# Patient Record
Sex: Male | Born: 1970 | Race: Black or African American | Hispanic: No | Marital: Single | Smoking: Current every day smoker
Health system: Southern US, Community
[De-identification: ages and names within clinical notes are randomized; demographics above are authoritative.]

---

## 2008-07-20 ENCOUNTER — Emergency Department (HOSPITAL_COMMUNITY): Admission: EM | Admit: 2008-07-20 | Discharge: 2008-07-20 | Payer: Self-pay | Admitting: Emergency Medicine

## 2010-06-04 IMAGING — CR DG CHEST 2V
2 series · 2 of 2 positions shown · non-contrast
Comparison: None

CLINICAL DATA: Fever, cough, smoking history

CHEST - 2 VIEW

[w chest pa]
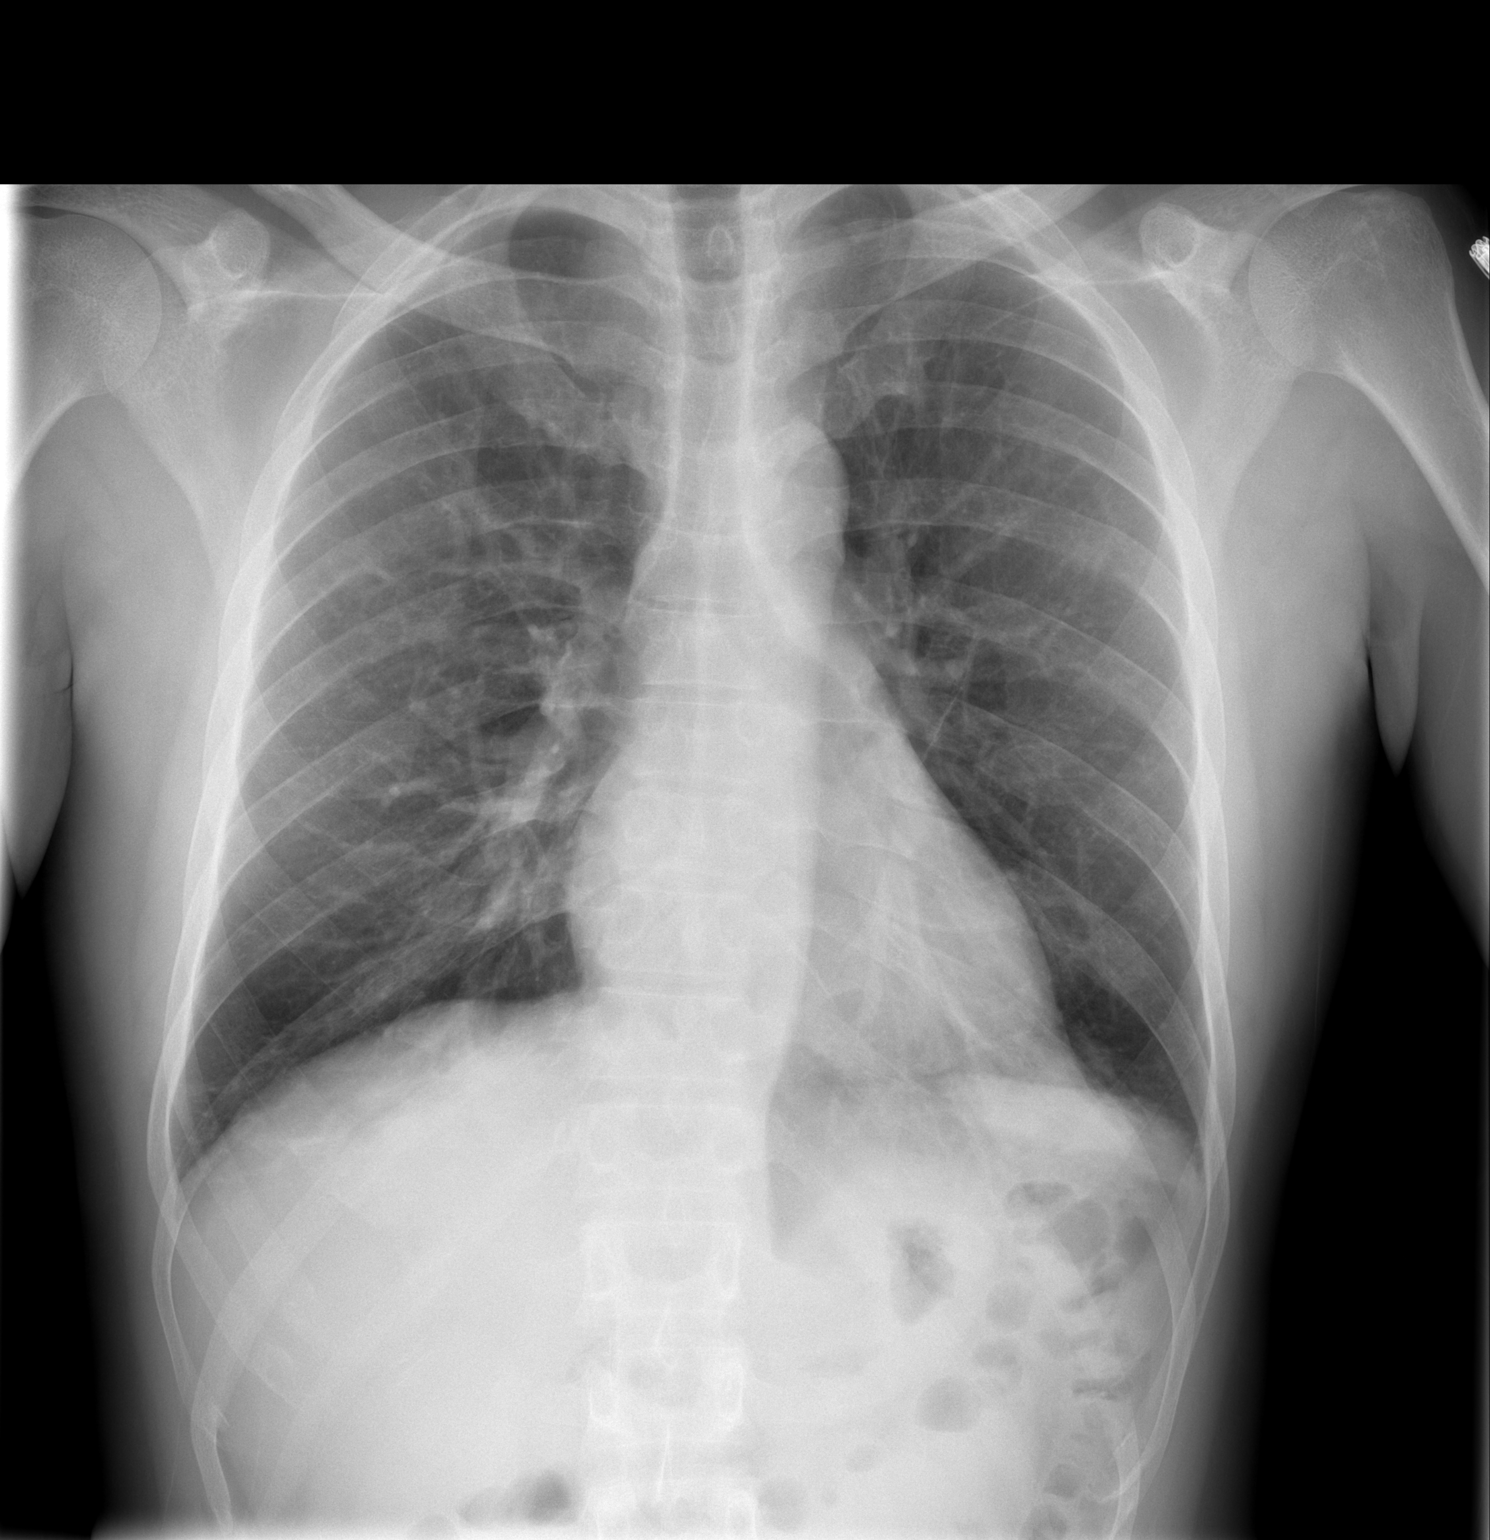

[w chest lat]
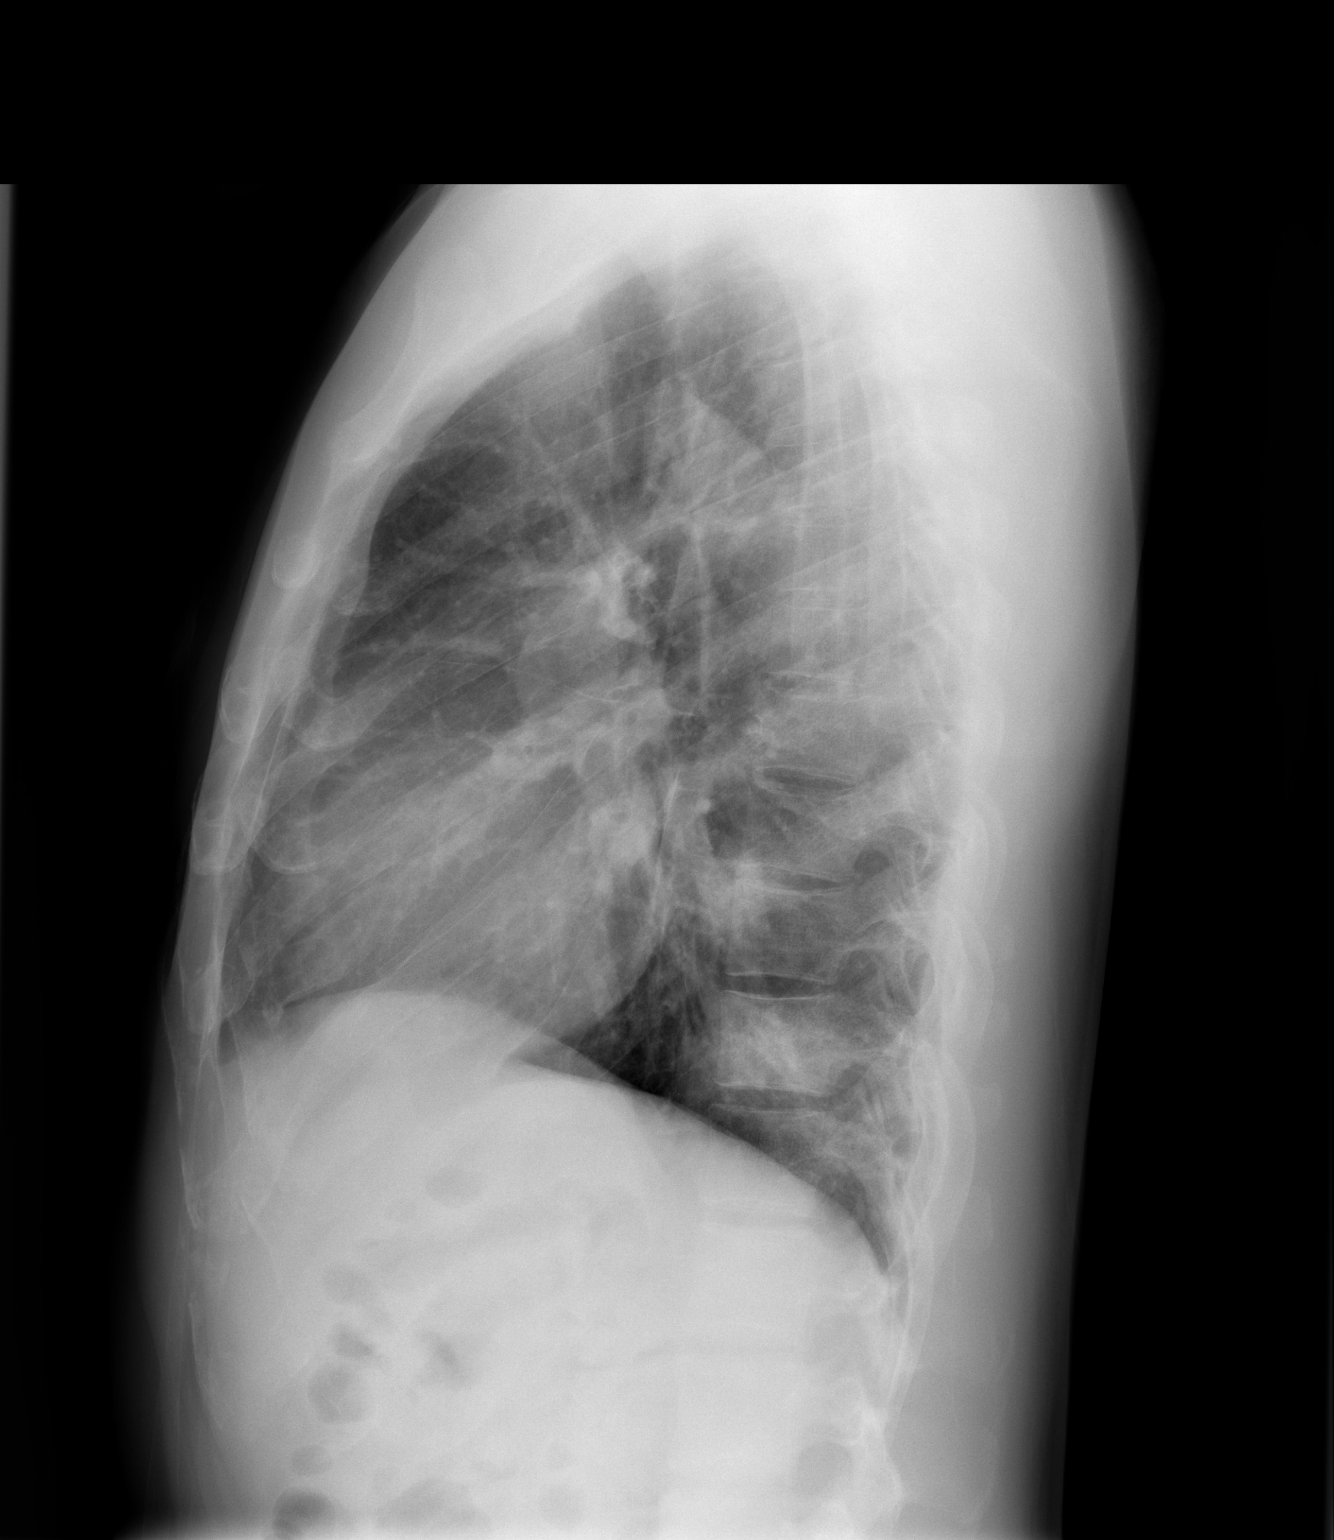

[2 of 2 positions shown; findings below may reference images not displayed]

FINDINGS: There is opacity in both lower lobes left slightly
greater than right most consistent with bilateral lower lobe
pneumonia.  No effusion is seen.  The heart is within normal limits
in size.  No acute bony abnormality is noted.
IMPRESSION: Lower lobe opacities left greater than right most consistent with
pneumonia.

## 2010-08-23 LAB — BASIC METABOLIC PANEL
BUN: 9 mg/dL (ref 6–23)
CO2: 25 mEq/L (ref 19–32)
Chloride: 103 mEq/L (ref 96–112)
GFR calc non Af Amer: >60 mL/min (ref >60)
Glucose, Bld: 145 mg/dL — ABNORMAL HIGH (ref 70–99)
Potassium: 3.6 mEq/L (ref 3.5–5.1)
Sodium: 136 mEq/L (ref 135–145)

## 2010-08-23 LAB — DIFFERENTIAL
Basophils Absolute: 0 10*3/uL (ref 0.0–0.1)
Eosinophils Absolute: 0 10*3/uL (ref 0.0–0.7)
Eosinophils Relative: 0 % (ref 0–5)
Lymphocytes Relative: 8 % — ABNORMAL LOW (ref 12–46)
Lymphs Abs: 1 10*3/uL (ref 0.7–4.0)
Monocytes Absolute: 1.1 10*3/uL — ABNORMAL HIGH (ref 0.1–1.0)

## 2010-08-23 LAB — CBC
HCT: 40.6 % (ref 39.0–52.0)
Hemoglobin: 13.9 g/dL (ref 13.0–17.0)
MCV: 94.6 fL (ref 78.0–100.0)
RDW: 14.4 % (ref 11.5–15.5)

## 2013-12-01 DIAGNOSIS — H401113 Primary open-angle glaucoma, right eye, severe stage: Secondary | ICD-10-CM | POA: Insufficient documentation

## 2014-10-07 DIAGNOSIS — H401122 Primary open-angle glaucoma, left eye, moderate stage: Secondary | ICD-10-CM | POA: Insufficient documentation

## 2016-12-12 ENCOUNTER — Encounter: Payer: Self-pay | Admitting: Podiatry

## 2016-12-12 ENCOUNTER — Ambulatory Visit (INDEPENDENT_AMBULATORY_CARE_PROVIDER_SITE_OTHER): Payer: 59

## 2016-12-12 ENCOUNTER — Ambulatory Visit (INDEPENDENT_AMBULATORY_CARE_PROVIDER_SITE_OTHER): Payer: 59 | Admitting: Podiatry

## 2016-12-12 VITALS — BP 101/69 | HR 72 | Resp 16

## 2016-12-12 DIAGNOSIS — Q828 Other specified congenital malformations of skin: Secondary | ICD-10-CM | POA: Diagnosis not present

## 2016-12-12 DIAGNOSIS — M2012 Hallux valgus (acquired), left foot: Secondary | ICD-10-CM

## 2016-12-12 DIAGNOSIS — M069 Rheumatoid arthritis, unspecified: Secondary | ICD-10-CM | POA: Diagnosis not present

## 2016-12-12 LAB — CBC WITH DIFFERENTIAL/PLATELET
Basophils Absolute: 0 cells/uL (ref 0–200)
Basophils Relative: 0 %
EOS PCT: 1 %
Eosinophils Absolute: 31 cells/uL (ref 15–500)
HCT: 45.1 % (ref 38.5–50.0)
HEMOGLOBIN: 14.6 g/dL (ref 13.2–17.1)
LYMPHS ABS: 1612 {cells}/uL (ref 850–3900)
Lymphocytes Relative: 52 %
MCH: 31.2 pg (ref 27.0–33.0)
MCHC: 32.4 g/dL (ref 32.0–36.0)
MCV: 96.4 fL (ref 80.0–100.0)
MPV: 8.6 fL (ref 7.5–12.5)
Monocytes Absolute: 403 cells/uL (ref 200–950)
Monocytes Relative: 13 %
NEUTROS ABS: 1054 {cells}/uL — AB (ref 1500–7800)
Neutrophils Relative %: 34 %
PLATELETS: 279 10*3/uL (ref 140–400)
RBC: 4.68 MIL/uL (ref 4.20–5.80)
RDW: 14.3 % (ref 11.0–15.0)
WBC: 3.1 10*3/uL — AB (ref 3.8–10.8)

## 2016-12-12 NOTE — Progress Notes (Signed)
He presents today concerned about his great toe left. He states that he has an interdigital painful lesion for the past 2 weeks. He states that it was open but is now closed his been cleaning with peroxide. He states it is very tender. He states that things because about this large bunion and I have an orthopedic doctor taking care of the bunion thinking that he may need surgery. He tried an injection and orthotics.  Objective: Vital signs stable alert and oriented 3 pulses are strongly palpable. Neurologic sensorium is intact. Deep tendon reflexes are intact. Muscle strength +5 over 5 dorsiflexion plantar flexors and inverters everters onto the musculature is intact. Orthopedic evaluation demonstrates hallux valgus deformity with what appears to be rheumatoid lateral deviation of the lesser metatarsals as well. Radiographs taken today do not specifically display rheumatoid disease however lateral deviation of the hallux and near subluxation of the first metatarsophalangeal joint would be indicative of that. He does relate swelling prior to this deviation occurring. Reactive hyperkeratotic lesion lateral aspect of the left hallux due to the juxtaposition of the hallux and second toe.  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 Cannot really rule out rheumatoid arthritis  Plan: Request blood work for rheumatoid arthritis. Reactive hyperkeratotic lesion discussed surgical intervention. Dispensed interdigital pads. Follow-up with him in 2-3 weeks

## 2016-12-13 LAB — ANA, IFA COMPREHENSIVE PANEL
Anti Nuclear Antibody(ANA): NEGATIVE
ENA SM AB SER-ACNC: NEGATIVE
SCLERODERMA (SCL-70) (ENA) ANTIBODY, IGG: NEGATIVE
SM/RNP: NEGATIVE
SSA (RO) (ENA) ANTIBODY, IGG: NEGATIVE
SSB (La) (ENA) Antibody, IgG: <1
ds DNA Ab: 13 IU/mL — ABNORMAL HIGH

## 2016-12-13 LAB — SEDIMENTATION RATE: SED RATE: 1 mm/h (ref 0–15)

## 2016-12-13 LAB — URIC ACID: URIC ACID, SERUM: 4.5 mg/dL (ref 4.0–8.0)

## 2016-12-13 LAB — C-REACTIVE PROTEIN: CRP: 0.5 mg/L (ref <8.0)

## 2016-12-13 LAB — RHEUMATOID FACTOR: Rhuematoid fact SerPl-aCnc: <14 IU/mL (ref <14)

## 2016-12-17 ENCOUNTER — Telehealth: Payer: Self-pay | Admitting: *Deleted

## 2016-12-17 NOTE — Telephone Encounter (Addendum)
-----   Message from Elinor Parkinson, [place] sent at 12/16/2016  7:40 AM EDT ----- Refer to rheumatology and send labs.12/17/2016-I informed of Dr. Geryl Rankins review of labs and referral to Rheumatology. Pt asked if the Rheumatologist accepted Workers Compensation, I told him I could speak with his workers Hydrologist. Pt states he will bring that information to the office tomorrow.12/19/2016-Called Office of [employer] states waiting behind 12 callers. 12/20/2016-Called Office of [employer] states waiting behind 17 callers, 12 minute wait minimum. 01/02/2017-Referral required form, clinicals and demographics including Workers' Comp paperwork faxed to [facility].

## 2017-02-11 ENCOUNTER — Ambulatory Visit: Payer: 59 | Admitting: Podiatry

## 2017-07-01 ENCOUNTER — Ambulatory Visit: Payer: 59 | Admitting: Podiatry

## 2017-07-17 ENCOUNTER — Telehealth: Payer: Self-pay | Admitting: Podiatry

## 2017-07-17 NOTE — Telephone Encounter (Signed)
Called pt and let him know that his medical records were ready to be picked up at his convenience.

## 2018-04-21 ENCOUNTER — Ambulatory Visit: Payer: Worker's Compensation | Admitting: Podiatry

## 2018-04-27 ENCOUNTER — Ambulatory Visit (INDEPENDENT_AMBULATORY_CARE_PROVIDER_SITE_OTHER): Payer: Worker's Compensation

## 2018-04-27 ENCOUNTER — Ambulatory Visit (INDEPENDENT_AMBULATORY_CARE_PROVIDER_SITE_OTHER): Payer: Worker's Compensation | Admitting: Podiatry

## 2018-04-27 ENCOUNTER — Other Ambulatory Visit: Payer: Self-pay | Admitting: Podiatry

## 2018-04-27 DIAGNOSIS — M19072 Primary osteoarthritis, left ankle and foot: Secondary | ICD-10-CM

## 2018-04-27 DIAGNOSIS — Z9889 Other specified postprocedural states: Secondary | ICD-10-CM

## 2018-04-27 DIAGNOSIS — G5792 Unspecified mononeuropathy of left lower limb: Secondary | ICD-10-CM

## 2018-04-27 DIAGNOSIS — S99922A Unspecified injury of left foot, initial encounter: Secondary | ICD-10-CM

## 2018-04-27 MED ORDER — DICLOFENAC SODIUM 75 MG PO TBEC: 75.0000 mg | DELAYED_RELEASE_TABLET | Freq: Two times a day (BID) | ORAL | 0 refills | Status: DC

## 2018-04-27 NOTE — Progress Notes (Signed)
   HPI: 47 year old otherwise healthy male presents the office today for a second opinion regarding his surgery that he had in the past.  In November 2018 the patient underwent bunion repair by Dr. Delrae Sawyers, [facility], in [place], [place].  Patient has had residual pain and tenderness ever since the surgery.  He is also lost the ability to move his toes despite no surgery other than the bunion repair.  He continues to have daily pain.  He has been doing physical therapy for the past year without any improvement of symptoms. Patient was last seen here in the office on 12/12/2016 by Dr. Al Corpus.  At that time arthritic panel was ordered, and patient never proceeded with surgery at that time.  No past medical history on file.   Physical Exam: General: The patient is alert and oriented x3 in no acute distress.  Dermatology: Skin is warm, dry and supple bilateral lower extremities. Negative for open lesions or macerations.  Vascular: Palpable pedal pulses bilaterally. No edema or erythema noted. Capillary refill within normal limits.  Neurological: Epicritic and protective threshold grossly intact bilaterally.   Musculoskeletal Exam: Range of motion within normal limits to all pedal and ankle joints bilateral. Muscle strength absent with flexion and extension of the digits 1-5 left foot.  There is limited range of motion to the first MTPJ with pain on palpation range of motion noted as well.  Radiographic Exam:  Normal osseous mineralization.  Joint space narrowing with degenerative changes noted the first MTPJ left foot.  Orthopedic plate and screws appear to be intact along the first metatarsal.  Arthrex tight rope intact across the first interspace left foot.  Hardware otherwise intact with the first Delaney in good alignment.  No fracture/dislocation/boney destruction.    Assessment: 1.  History of bunionectomy left foot. DOS: 03/2017 2.  DJD first MTPJ left 3.  Loss of motion digits 1-5  left forefoot secondary to likely nerve damage  Plan of Care:  1. Patient evaluated. X-Rays reviewed.  2.  Today we are going to order nerve conduction study to determine the level of neuropathic changes.  If there is anything significant we will likely order neurology consult 3.  I explained to the patient that surgically the only thing that could be performed is removal of hardware which is palpable on clinical exam and sensitive.  Also offer a joint arthroplasty with implant to the first MTPJ to increase the range of motion.  I explained to him that surgically there is nothing that can be provided to increase the patient's flexion and extension of the digits.  This is strictly a neuropathic etiology. 4.  Continue wearing custom molded orthotics 5.  Return to clinic in 4 weeks to review nerve conduction velocity exams      Felecia Shelling, DPM [facility]  Dr. Felecia Shelling, DPM    [address] [place], [place] [address]                Office [phone]  Fax [phone]

## 2018-04-28 ENCOUNTER — Telehealth: Payer: Self-pay | Admitting: *Deleted

## 2018-04-28 ENCOUNTER — Other Ambulatory Visit: Payer: Self-pay | Admitting: *Deleted

## 2018-04-28 ENCOUNTER — Encounter: Payer: Self-pay | Admitting: Neurology

## 2018-04-28 DIAGNOSIS — G629 Polyneuropathy, unspecified: Secondary | ICD-10-CM

## 2018-04-28 DIAGNOSIS — G5792 Unspecified mononeuropathy of left lower limb: Secondary | ICD-10-CM

## 2018-04-28 DIAGNOSIS — M2012 Hallux valgus (acquired), left foot: Secondary | ICD-10-CM

## 2018-04-28 DIAGNOSIS — M19072 Primary osteoarthritis, left ankle and foot: Secondary | ICD-10-CM

## 2018-04-28 NOTE — Telephone Encounter (Signed)
-----   Message from Felecia Shelling, DPM sent at 04/27/2018  6:23 PM EST ----- Regarding: Nerve conduction study Please order nerve conduction study left lower extremity.   Dx: Iatrogenic neuropathy left lower extremity.  Loss of strength and motion of the digits postsurgically.  Patient had bunion surgery on 03/2017 in [place].   Thanks, Dr. Logan Bores

## 2018-04-28 NOTE — Telephone Encounter (Signed)
Faxed orders to [facility]. 

## 2018-04-28 NOTE — Telephone Encounter (Signed)
Pt states he would be okay with testing in [place], I informed pt of [facility] [phone].

## 2018-05-07 ENCOUNTER — Ambulatory Visit: Payer: Self-pay | Admitting: Neurology

## 2018-05-07 ENCOUNTER — Encounter

## 2018-05-25 ENCOUNTER — Ambulatory Visit: Payer: Worker's Compensation | Admitting: Podiatry

## 2018-05-26 ENCOUNTER — Encounter: Payer: Self-pay | Admitting: Neurology

## 2018-11-24 DIAGNOSIS — T8484XA Pain due to internal orthopedic prosthetic devices, implants and grafts, initial encounter: Secondary | ICD-10-CM | POA: Insufficient documentation

## 2020-02-24 ENCOUNTER — Other Ambulatory Visit (HOSPITAL_COMMUNITY)
Admission: RE | Admit: 2020-02-24 | Discharge: 2020-02-24 | Disposition: A | Discharge status: Home / Self Care | Payer: 59 | Source: Ambulatory Visit | Attending: Critical Care Medicine | Admitting: Critical Care Medicine

## 2020-02-24 ENCOUNTER — Ambulatory Visit: Payer: 59 | Admitting: Physician Assistant

## 2020-02-24 VITALS — BP 127/82 | HR 79 | Temp 96.6°F | Resp 18 | Ht 73.0 in | Wt 150.0 lb

## 2020-02-24 DIAGNOSIS — Z202 Contact with and (suspected) exposure to infections with a predominantly sexual mode of transmission: Secondary | ICD-10-CM

## 2020-02-24 MED ORDER — METRONIDAZOLE 500 MG PO TABS
2000.0000 mg | ORAL_TABLET | Freq: Once | ORAL | Status: AC
Start: 2020-02-24 — End: 2020-02-24
  Administered 2020-02-24: 2000 mg via ORAL

## 2020-02-24 MED ORDER — METRONIDAZOLE 500 MG PO TABS: 500.0000 mg | ORAL_TABLET | Freq: Once | ORAL | Status: DC

## 2020-02-24 NOTE — Progress Notes (Signed)
Patients partner shared he may have an exposure to trichomonas due to their positive result. Patient complains of itching and denies burning, discharge and blood.

## 2020-02-24 NOTE — Patient Instructions (Signed)
I encourage you to locate and make an appointment with a primary care provider in your network.  Please feel free to return to the mobile unit next month for fasting labs  We will call you with your lab results  Please let us know if there is anything else we can do for you  Roney Jaffe, PA-C Physician Assistant [facility] Medicine [email or web address]   Trichomoniasis Trichomoniasis is an STI (sexually transmitted infection) that can affect both women and men. In women, the outer area of the male genitalia (vulva) and the vagina are affected. In men, mainly the penis is affected, but the prostate and other reproductive organs can also be involved.  This condition can be treated with medicine. It often has no symptoms (is asymptomatic), especially in men. If not treated, trichomoniasis can last for months or years. What are the causes? This condition is caused by a parasite called Trichomonas vaginalis. Trichomoniasis most often spreads from person to person (is contagious) through sexual contact. What increases the risk? The following factors may make you more likely to develop this condition:  Having unprotected sex.  Having sex with a partner who has trichomoniasis.  Having multiple sexual partners.  Having had previous trichomoniasis infections or other STIs. What are the signs or symptoms? In women, symptoms of trichomoniasis include:  Abnormal vaginal discharge that is clear, white, gray, or yellow-green and foamy and has an unusual "fishy" odor.  Itching and irritation of the vagina and vulva.  Burning or pain during urination or sex.  Redness and swelling of the genitals. In men, symptoms of trichomoniasis include:  Penile discharge that may be foamy or contain pus.  Pain in the penis. This may happen only when urinating.  Itching or irritation inside the penis.  Burning after urination or ejaculation. How is  this diagnosed? In women, this condition may be found during a routine Pap test or physical exam. It may be found in men during a routine physical exam. Your health care provider may do tests to help diagnose this infection, such as:  Urine tests (men and women).  The following in women: ? Testing the pH of the vagina. ? A vaginal swab test that checks for the Trichomonas vaginalis parasite. ? Testing vaginal secretions. Your health care provider may test you for other STIs, including HIV (human immunodeficiency virus). How is this treated? This condition is treated with medicine taken by mouth (orally), such as metronidazole or tinidazole, to fight the infection. Your sexual partner(s) also need to be tested and treated.  If you are a woman and you plan to become pregnant or think you may be pregnant, tell your health care provider right away. Some medicines that are used to treat the infection should not be taken during pregnancy. Your health care provider may recommend over-the-counter medicines or creams to help relieve itching or irritation. You may be tested for infection again 3 months after treatment. Follow these instructions at home:  Take and use over-the-counter and prescription medicines, including creams, only as told by your health care provider.  Take your antibiotic medicine as told by your health care provider. Do not stop taking the antibiotic even if you start to feel better.  Do not have sex until 7-10 days after you finish your medicine, or until your health care provider approves. Ask your health care provider when you may start to have sex again.  (Women) Do not douche or wear tampons while you have the infection.  Discuss your infection with your sexual partner(s). Make sure that your partner gets tested and treated, if necessary.  Keep all follow-up visits as told by your health care provider. This is important. How is this prevented?   Use condoms every time  you have sex. Using condoms correctly and consistently can help protect against STIs.  Avoid having multiple sexual partners.  Talk with your sexual partner about any symptoms that either of you may have, as well as any history of STIs.  Get tested for STIs and STDs (sexually transmitted diseases) before you have sex. Ask your partner to do the same.  Do not have sexual contact if you have symptoms of trichomoniasis or another STI. Contact a health care provider if:  You still have symptoms after you finish your medicine.  You develop pain in your abdomen.  You have pain when you urinate.  You have bleeding after sex.  You develop a rash.  You feel nauseous or you vomit.  You plan to become pregnant or think you may be pregnant. Summary  Trichomoniasis is an STI (sexually transmitted infection) that can affect both women and men.  This condition often has no symptoms (is asymptomatic), especially in men.  Without treatment, this condition can last for months or years.  You should not have sex until 7-10 days after you finish your medicine, or until your health care provider approves. Ask your health care provider when you may start to have sex again.  Discuss your infection with your sexual partner(s). Make sure that your partner gets tested and treated, if necessary. This information is not intended to replace advice given to you by your health care provider. Make sure you discuss any questions you have with your health care provider. Document Revised: 02/10/2018 Document Reviewed: 02/10/2018 Elsevier Patient Education  2020 [employer].

## 2020-02-24 NOTE — Progress Notes (Signed)
New Patient Office Visit  Subjective:  Patient ID: Jeremy Cisneros, male    DOB: 08/13/1970  Age: 49 y.o. MRN: [id number]  CC:  Chief Complaint  Patient presents with  . Exposure to STD    HPI Jeremy Cisneros reports that he was exposed to trichomonas through his previous partner, states that he went to the health department after learning this, approximately 3 to 4 weeks ago, states that he was given 4 pills to take orally, but has not heard any results from his tests.  Reports that he had intermittent itching inside his penis prior to going to the health department, states it has continued even after taking the for pills.  He is unsure of what pills were given to him.  Denies discharge, sores, numbness or tingling, no change in bowel movements, no dysuria.     History reviewed. No pertinent past medical history.  History reviewed. No pertinent surgical history.  History reviewed. No pertinent family history.  Social History   Socioeconomic History  . Marital status: Single    Spouse name: Not on file  . Number of children: Not on file  . Years of education: Not on file  . Highest education level: Not on file  Occupational History  . Not on file  Tobacco Use  . Smoking status: Current Every Day Smoker  . Smokeless tobacco: Never Used  Substance and Sexual Activity  . Alcohol use: Yes  . Drug use: Not on file  . Sexual activity: Not on file  Other Topics Concern  . Not on file  Social History Narrative  . Not on file   Social Determinants of Health   Financial Resource Strain:   . Difficulty of Paying Living Expenses: Not on file  Food Insecurity:   . Worried About Programme researcher, broadcasting/film/video in the Last Year: Not on file  . Ran Out of Food in the Last Year: Not on file  Transportation Needs:   . Lack of Transportation (Medical): Not on file  . Lack of Transportation (Non-Medical): Not on file  Physical Activity:   . Days of Exercise per Week: Not on file  . Minutes of  Exercise per Session: Not on file  Stress:   . Feeling of Stress : Not on file  Social Connections:   . Frequency of Communication with Friends and Family: Not on file  . Frequency of Social Gatherings with Friends and Family: Not on file  . Attends Religious Services: Not on file  . Active Member of Clubs or Organizations: Not on file  . Attends Banker Meetings: Not on file  . Marital Status: Not on file  Intimate Partner Violence:   . Fear of Current or Ex-Partner: Not on file  . Emotionally Abused: Not on file  . Physically Abused: Not on file  . Sexually Abused: Not on file    ROS Review of Systems  Constitutional: Negative for chills, fatigue and fever.  HENT: Negative.   Eyes: Negative.   Respiratory: Negative.   Cardiovascular: Negative.   Gastrointestinal: Negative.   Endocrine: Negative.   Genitourinary: Negative for difficulty urinating, discharge, dysuria, flank pain, genital sores, penile pain, penile swelling, scrotal swelling and testicular pain.  Musculoskeletal: Negative.   Skin: Negative.   Allergic/Immunologic: Negative.   Neurological: Negative.   Hematological: Negative.   Psychiatric/Behavioral: Negative.     Objective:   Today's Vitals: BP 127/82 (BP Location: Left Arm, Patient Position: Sitting, Cuff Size: Normal)   Pulse  79   Temp (!) 96.6 F (35.9 C) (Oral)   Resp 18   Ht 6\' 1"  (1.854 m)   Wt 150 lb (68 kg)   SpO2 98%   BMI 19.79 kg/m   Physical Exam Vitals and nursing note reviewed.  Constitutional:      Appearance: Normal appearance.  HENT:     Head: Normocephalic and atraumatic.     Right Ear: External ear normal.     Left Ear: External ear normal.     Nose: Nose normal.     Mouth/Throat:     Mouth: Mucous membranes are moist.     Pharynx: Oropharynx is clear.  Eyes:     Extraocular Movements: Extraocular movements intact.     Conjunctiva/sclera: Conjunctivae normal.     Pupils: Pupils are equal, round, and  reactive to light.  Cardiovascular:     Rate and Rhythm: Normal rate and regular rhythm.     Pulses: Normal pulses.     Heart sounds: Normal heart sounds.  Pulmonary:     Effort: Pulmonary effort is normal.     Breath sounds: Normal breath sounds.  Abdominal:     General: Abdomen is flat.     Palpations: Abdomen is soft.  Musculoskeletal:        General: Normal range of motion.     Cervical back: Normal range of motion and neck supple.  Skin:    General: Skin is warm and dry.  Neurological:     General: No focal deficit present.     Mental Status: He is oriented to person, place, and time. Mental status is at baseline.  Psychiatric:        Mood and Affect: Mood normal.        Behavior: Behavior normal.        Thought Content: Thought content normal.        Judgment: Judgment normal.     Assessment & Plan:   Problem List Items Addressed This Visit    None    Visit Diagnoses    Exposure to sexually transmitted disease (STD)    -  Primary   Relevant Orders   Urine cytology ancillary only   RPR   HIV antibody (with reflex)      Outpatient Encounter Medications as of 02/24/2020  Medication Sig  . dorzolamide-timolol (COSOPT) 22.3-6.8 MG/ML ophthalmic solution   . etodolac (LODINE) 400 MG tablet   . timolol (TIMOPTIC) 0.5 % ophthalmic solution Place 1 drop into both eyes 2 times daily.  . [DISCONTINUED] diclofenac (VOLTAREN) 75 MG EC tablet Take 1 tablet (75 mg total) by mouth 2 (two) times daily.   No facility-administered encounter medications on file as of 02/24/2020.   1. Exposure to sexually transmitted disease (STD) Patient given 2 gm oral metronidazole, patient education.  Patient was encouraged to find and establish a primary care provider, welcomed to return to mobile unit for fasting labs.  Patient wants to remain in [facility] for his primary care. - Urine cytology ancillary only - RPR - HIV antibody (with reflex)   I have reviewed the patient's medical  history (PMH, PSH, Social History, Family History, Medications, and allergies) , and have been updated if relevant. I spent 30 minutes reviewing chart and  face to face time with patient.    Follow-up: Return if symptoms worsen or fail to improve.   [facility] Mayers, PA-C

## 2020-02-25 LAB — RPR: RPR Ser Ql: NONREACTIVE

## 2020-02-25 LAB — HIV ANTIBODY (ROUTINE TESTING W REFLEX): HIV Screen 4th Generation wRfx: NONREACTIVE

## 2020-02-28 LAB — URINE CYTOLOGY ANCILLARY ONLY
Bacterial Vaginitis-Urine: NEGATIVE
Candida Urine: NEGATIVE
Chlamydia: NEGATIVE
Comment: NEGATIVE
Comment: NEGATIVE
Comment: NORMAL
Neisseria Gonorrhea: NEGATIVE
Trichomonas: NEGATIVE

## 2020-02-29 ENCOUNTER — Telehealth: Payer: Self-pay | Admitting: *Deleted

## 2020-02-29 NOTE — Telephone Encounter (Signed)
-----   Message from Cari S Mayers, PA-C sent at 02/28/2020  3:09 PM EDT ----- Please call patient and let him know that his results were negative for any STI. Thanks 

## 2020-03-01 NOTE — Telephone Encounter (Signed)
-----   Message from Roney Jaffe, [place] sent at 02/28/2020  3:09 PM EDT ----- Please call patient and let him know that his results were negative for any STI. Thanks

## 2020-03-01 NOTE — Telephone Encounter (Signed)
Medical Assistant left message on patient's home and cell voicemail. Voicemail states to give a call back to [place] with [facility] at [phone]
